# Patient Record
Sex: Male | Born: 1995 | Race: Black or African American | Hispanic: No | Marital: Married | Smoking: Never smoker
Health system: Southern US, Community
[De-identification: ages and names within clinical notes are randomized; demographics above are authoritative.]

---
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
## 2018-07-07 ENCOUNTER — Emergency Department (HOSPITAL_COMMUNITY): Payer: Medicaid Other

## 2018-07-07 ENCOUNTER — Emergency Department (HOSPITAL_COMMUNITY)
Admission: EM | Admit: 2018-07-07 | Discharge: 2018-07-07 | Disposition: A | Discharge status: Home / Self Care | Payer: Medicaid Other | Attending: Emergency Medicine | Admitting: Emergency Medicine

## 2018-07-07 ENCOUNTER — Encounter (HOSPITAL_COMMUNITY): Payer: Self-pay | Admitting: *Deleted

## 2018-07-07 DIAGNOSIS — R1030 Lower abdominal pain, unspecified: Secondary | ICD-10-CM | POA: Diagnosis present

## 2018-07-07 DIAGNOSIS — Z79899 Other long term (current) drug therapy: Secondary | ICD-10-CM | POA: Insufficient documentation

## 2018-07-07 DIAGNOSIS — K5900 Constipation, unspecified: Secondary | ICD-10-CM

## 2018-07-07 MED ORDER — POLYETHYLENE GLYCOL 3350 17 G PO PACK: 17.0000 g | PACK | Freq: Every day | ORAL | 0 refills | Status: DC

## 2018-07-07 MED ORDER — POLYETHYLENE GLYCOL 3350 17 G PO PACK: 17.0000 g | PACK | Freq: Every day | ORAL | 0 refills | Status: AC

## 2018-07-07 NOTE — ED Provider Notes (Signed)
[facility] Provider Note   CSN: [id number] Arrival date & time: 07/07/18  1302     History   Chief Complaint Chief Complaint  Patient presents with  . Constipation    HPI [place] is a 22 y.o. male.  HPI    22 year old male presents today with complaints of constipation.  Patient notes that over the last several days he has had increasingly hard bowel movements.  Patient notes he has to strain with his bowel movements and is noted a small amount of blood on his toilet paper.  Patient denies any bleeding without bowel movements.  Patient notes that his stools are very hard.  He notes some suprapubic and generalized abdominal pain, denies any fever chills nausea or vomiting.  Patient is accompanied by his grandmother today.  He denies any urinary complaints.   History reviewed. No pertinent past medical history.  There are no active problems to display for this patient.   History reviewed. No pertinent surgical history.      Home Medications    Prior to Admission medications   Medication Sig Start Date End Date Taking? Authorizing Provider  albuterol (PROVENTIL HFA;VENTOLIN HFA) 108 (90 BASE) MCG/ACT inhaler Inhale 2 puffs into the lungs every 6 (six) hours as needed for wheezing or shortness of breath.    [provider]  fluticasone (FLOVENT HFA) 110 MCG/ACT inhaler Inhale 2 puffs into the lungs 2 (two) times daily.    [provider]  HYDROcodone-acetaminophen (NORCO) 5-325 MG per tablet Take 1 tablet by mouth every 6 (six) hours as needed for moderate pain. 03/28/14   Viviano Simasobinson, Lauren, NP  polyethylene glycol [facility](MIRALAX) packet Take 17 g by mouth daily. 07/07/18   Eyvonne MechanicHedges, Claryce Friel, PA-C    Family History History reviewed. No pertinent family history.  Social History Social History   Tobacco Use  . Smoking status: Never Smoker  Substance Use Topics  . Alcohol use: No  . Drug use: Not on file      Allergies   Patient has no known allergies.   Review of Systems Review of Systems  All other systems reviewed and are negative.    Physical Exam Updated Vital Signs BP 138/61 (BP Location: Right Arm)   Pulse (!) 101   Temp 98.3 F (36.8 C) (Oral)   Resp 16   SpO2 100%   Physical Exam  Constitutional: He is oriented to person, place, and time. He appears well-developed and well-nourished.  HENT:  Head: Normocephalic and atraumatic.  Eyes: Pupils are equal, round, and reactive to light. Conjunctivae are normal. Right eye exhibits no discharge. Left eye exhibits no discharge. No scleral icterus.  Neck: Normal range of motion. No JVD present. No tracheal deviation present.  Soft with minimal tenderness over the suprapubic region-soft nontender  Pulmonary/Chest: Effort normal. No stridor.  Neurological: He is alert and oriented to person, place, and time. Coordination normal.  Psychiatric: He has a normal mood and affect. His behavior is normal. Judgment and thought content normal.  Nursing note and vitals reviewed.    ED Treatments / Results  Labs (all labs ordered are listed, but only abnormal results are displayed) Labs Reviewed - No data to display  EKG None  Radiology Dg Abdomen 1 View  Result Date: 07/07/2018 CLINICAL DATA:  Abdominal pain and constipation EXAM: ABDOMEN - 1 VIEW COMPARISON:  None. FINDINGS: There is moderate stool throughout much of the colon. The rectum and distal sigmoid colon are borderline  dilated with stool. There is no bowel dilatation or air-fluid level to suggest bowel obstruction. No free air. There is a phlebolith in the lower left pelvis. IMPRESSION: Stool throughout much of the colon with mild distention of the distal sigmoid and rectum with stool. There may be a degree of constipation. No bowel obstruction or free air evident. Electronically Signed   By: Bretta Bang III M.D.   On: 07/07/2018 13:38    Procedures Procedures  (including critical care time)  Medications Ordered in ED Medications - No data to display   Initial Impression / Assessment and Plan / ED Course  I have reviewed the triage vital signs and the nursing notes.  Pertinent labs & imaging results that were available during my care of the patient were reviewed by me and considered in my medical decision making (see chart for details).     22 year old male presents today with likely constipation.  Patient has no signs of infectious etiology no focal right lower quadrant tenderness that would indicate appendicitis no urinary symptoms.  His x-ray is consistent with constipation.  Patient will be placed on MiraLAX if he continues to have blood with bowel movements he will follow-up with his primary care.  He is given strict return precautions given any new or worsening signs or symptoms present.  Patient verbalized understanding and agreement to today's plan had no further questions or concerns.  Final Clinical Impressions(s) / ED Diagnoses   Final diagnoses:  Constipation, unspecified constipation type    ED Discharge Orders         Ordered    polyethylene glycol (MIRALAX) packet  Daily,   Status:  Discontinued     07/07/18 1534    polyethylene glycol (MIRALAX) packet  Daily     07/07/18 1534           Eyvonne Mechanic, PA-C 07/07/18 1543    Mancel Bale, MD 07/08/18 2113

## 2018-07-07 NOTE — ED Triage Notes (Addendum)
Pt in c/o suprapubic pain and constipation, states after he was straining he noted some blood on the tissue after his bowel movement, c/o lower abd pain, last normal BM was a few weeks ago, denies n/v

## 2018-07-07 NOTE — Discharge Instructions (Addendum)
Please read attached information. If you experience any new or worsening signs or symptoms please return to the emergency room for evaluation. Please follow-up with your primary care provider or specialist as discussed.  If you continue to have bleeding with bowel movements please see your primary care provider please use medication prescribed only as directed and discontinue taking if you have any concerning signs or symptoms.

## 2020-01-27 DIAGNOSIS — Z419 Encounter for procedure for purposes other than remedying health state, unspecified: Secondary | ICD-10-CM | POA: Diagnosis not present

## 2020-02-27 DIAGNOSIS — Z419 Encounter for procedure for purposes other than remedying health state, unspecified: Secondary | ICD-10-CM | POA: Diagnosis not present

## 2020-03-29 DIAGNOSIS — Z419 Encounter for procedure for purposes other than remedying health state, unspecified: Secondary | ICD-10-CM | POA: Diagnosis not present

## 2020-04-28 DIAGNOSIS — Z419 Encounter for procedure for purposes other than remedying health state, unspecified: Secondary | ICD-10-CM | POA: Diagnosis not present

## 2020-05-29 DIAGNOSIS — Z419 Encounter for procedure for purposes other than remedying health state, unspecified: Secondary | ICD-10-CM | POA: Diagnosis not present

## 2020-06-28 DIAGNOSIS — Z419 Encounter for procedure for purposes other than remedying health state, unspecified: Secondary | ICD-10-CM | POA: Diagnosis not present

## 2020-07-05 IMAGING — DX DG ABDOMEN 1V
2 series · 2 of 2 positions shown · non-contrast
Comparison: None.

CLINICAL DATA: Abdominal pain and constipation

EXAM:
ABDOMEN - 1 VIEW

[t abdomen supine (1 of 2)]
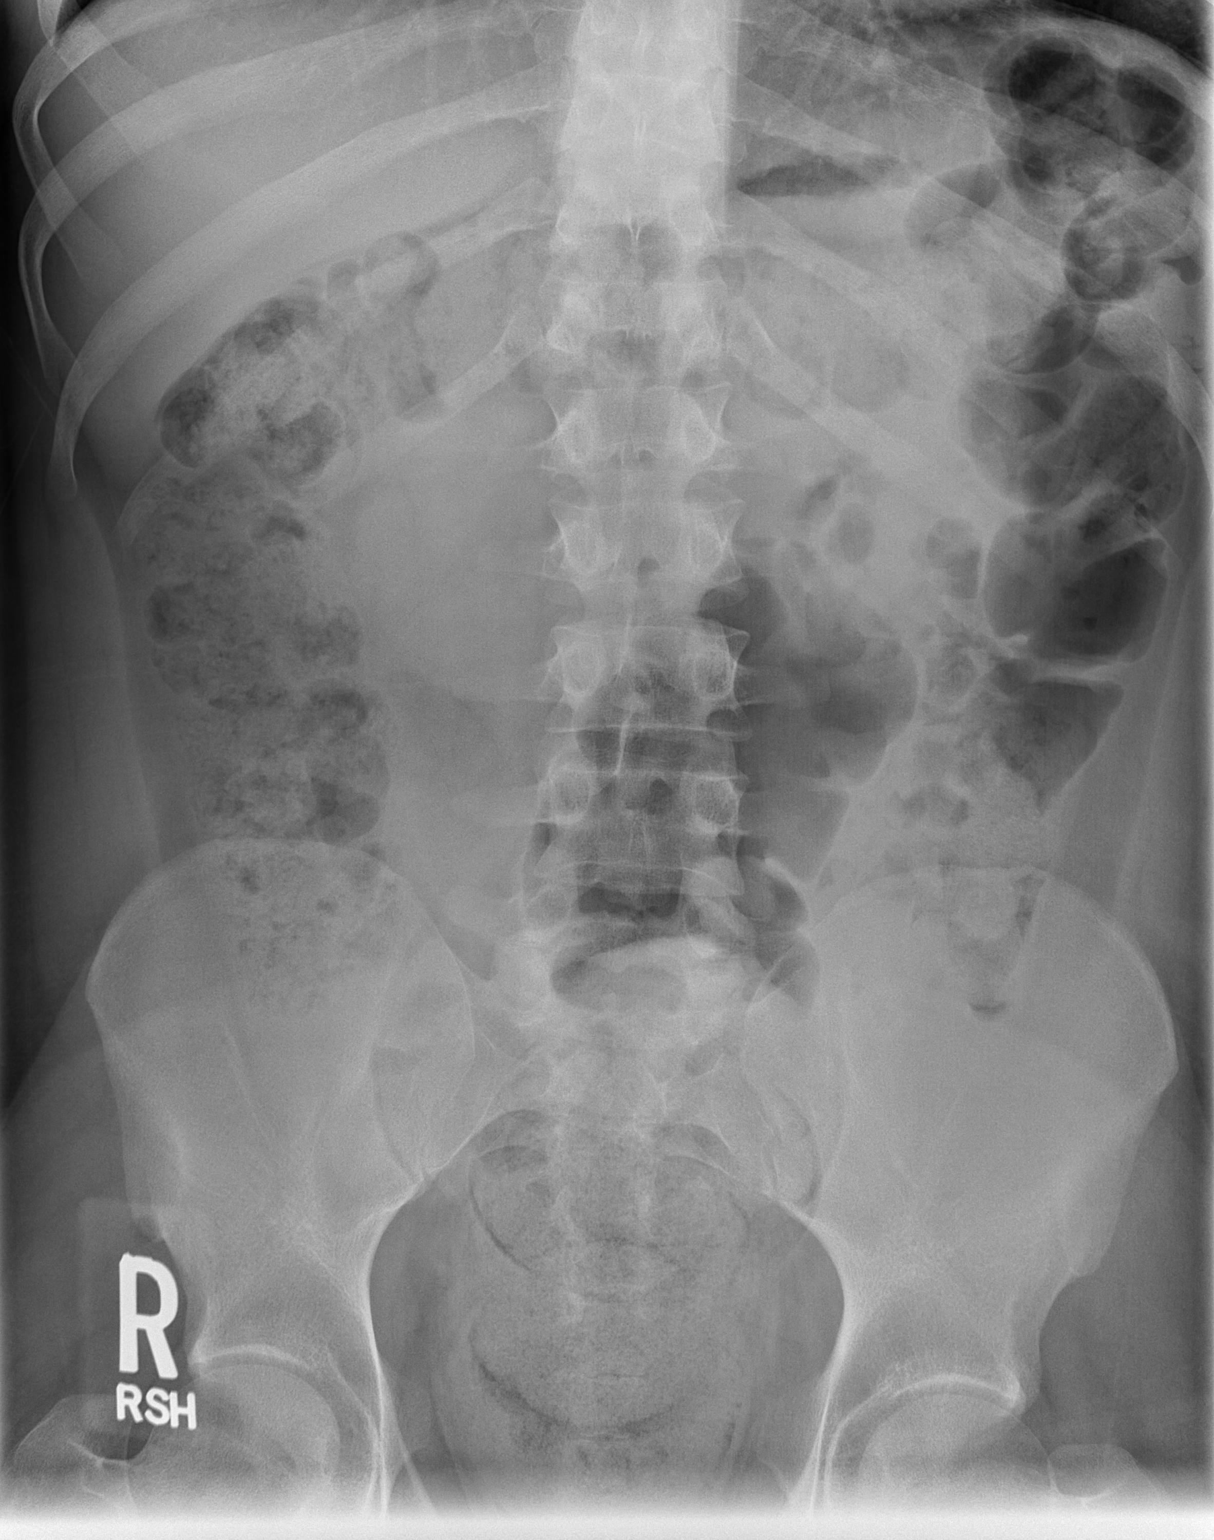

[t abdomen supine (2 of 2)]
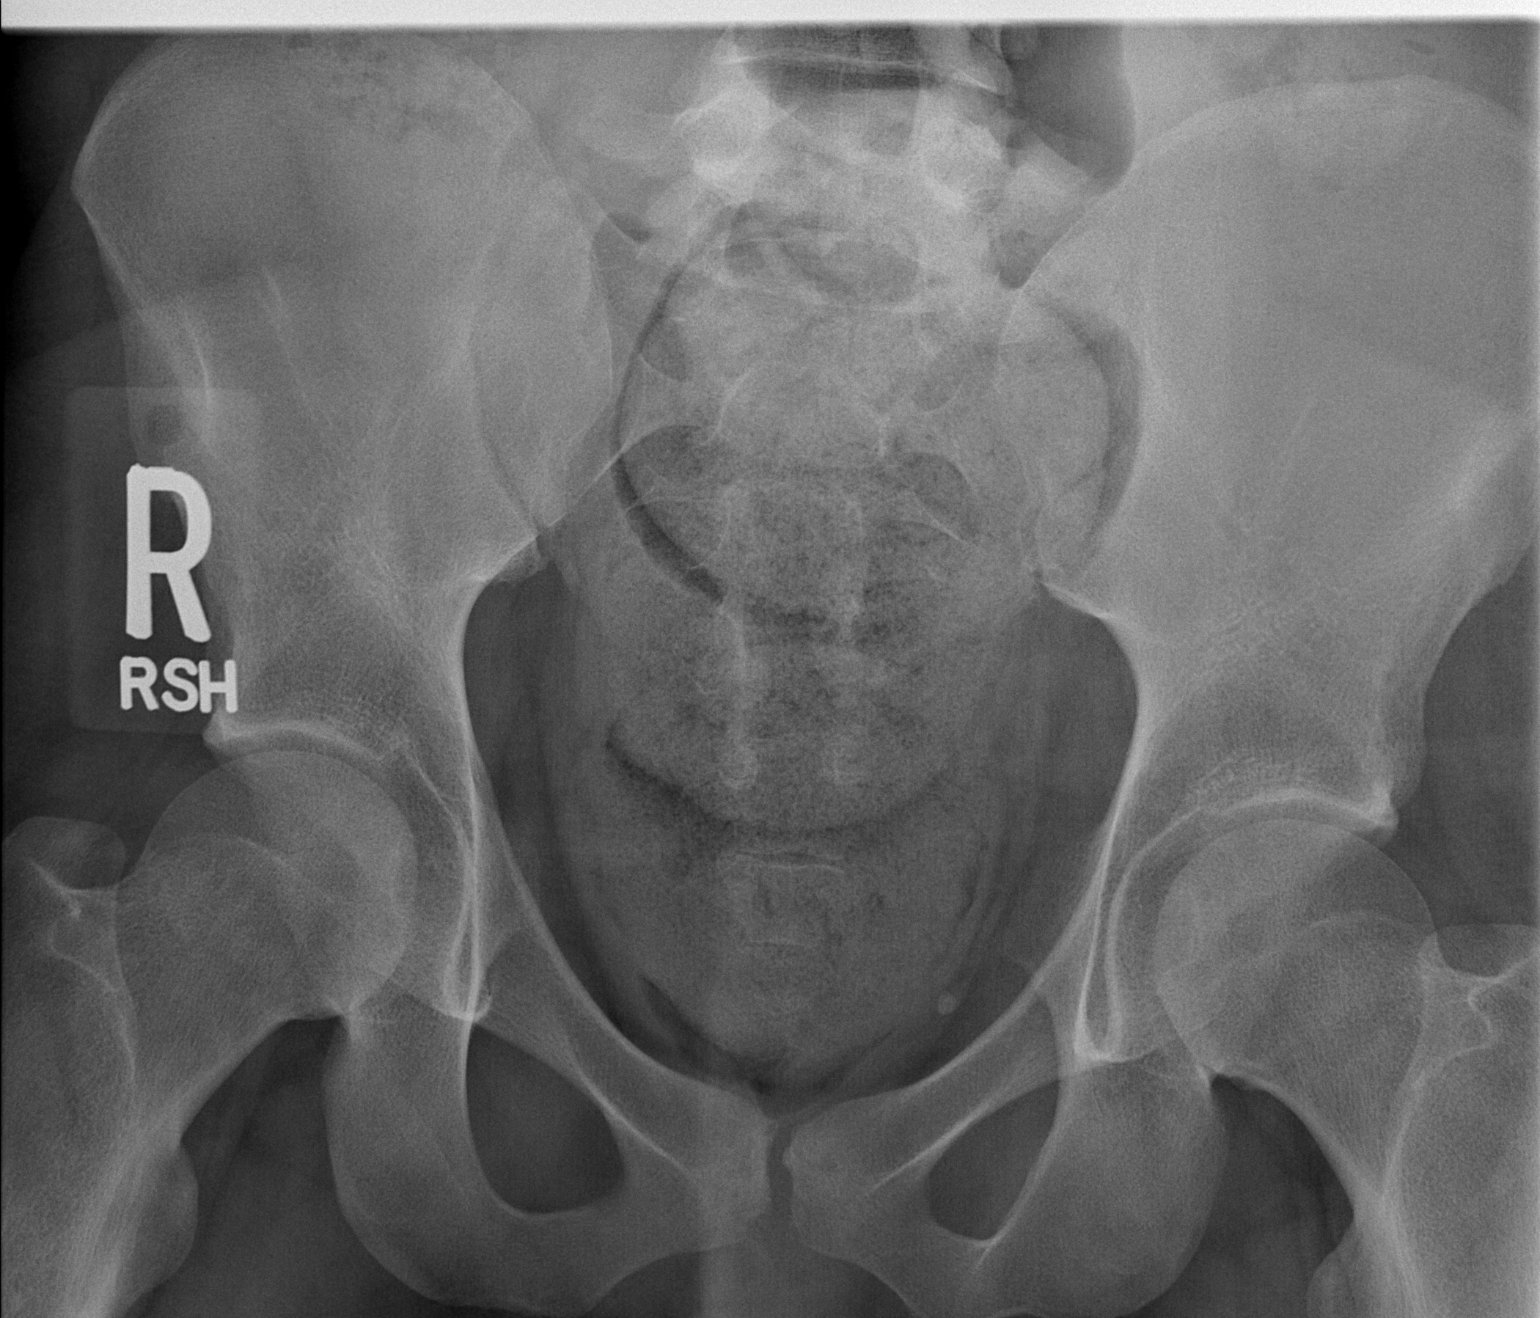

[2 of 2 positions shown; findings below may reference images not displayed]

FINDINGS: There is moderate stool throughout much of the colon. The rectum and
distal sigmoid colon are borderline dilated with stool. There is no
bowel dilatation or air-fluid level to suggest bowel obstruction. No
free air. There is a phlebolith in the lower left pelvis.
IMPRESSION: Stool throughout much of the colon with mild distention of the
distal sigmoid and rectum with stool. There may be a degree of
constipation. No bowel obstruction or free air evident.

## 2020-07-29 DIAGNOSIS — Z419 Encounter for procedure for purposes other than remedying health state, unspecified: Secondary | ICD-10-CM | POA: Diagnosis not present

## 2020-08-29 DIAGNOSIS — Z419 Encounter for procedure for purposes other than remedying health state, unspecified: Secondary | ICD-10-CM | POA: Diagnosis not present

## 2020-09-26 DIAGNOSIS — Z419 Encounter for procedure for purposes other than remedying health state, unspecified: Secondary | ICD-10-CM | POA: Diagnosis not present

## 2020-10-27 DIAGNOSIS — Z419 Encounter for procedure for purposes other than remedying health state, unspecified: Secondary | ICD-10-CM | POA: Diagnosis not present

## 2020-11-26 DIAGNOSIS — Z419 Encounter for procedure for purposes other than remedying health state, unspecified: Secondary | ICD-10-CM | POA: Diagnosis not present

## 2020-12-27 DIAGNOSIS — Z419 Encounter for procedure for purposes other than remedying health state, unspecified: Secondary | ICD-10-CM | POA: Diagnosis not present

## 2021-01-26 DIAGNOSIS — Z419 Encounter for procedure for purposes other than remedying health state, unspecified: Secondary | ICD-10-CM | POA: Diagnosis not present

## 2021-02-26 DIAGNOSIS — Z419 Encounter for procedure for purposes other than remedying health state, unspecified: Secondary | ICD-10-CM | POA: Diagnosis not present

## 2021-03-29 DIAGNOSIS — Z419 Encounter for procedure for purposes other than remedying health state, unspecified: Secondary | ICD-10-CM | POA: Diagnosis not present

## 2021-04-28 DIAGNOSIS — Z419 Encounter for procedure for purposes other than remedying health state, unspecified: Secondary | ICD-10-CM | POA: Diagnosis not present

## 2021-05-29 DIAGNOSIS — Z419 Encounter for procedure for purposes other than remedying health state, unspecified: Secondary | ICD-10-CM | POA: Diagnosis not present
